# Patient Record
Sex: Female | Born: 1990 | Hispanic: Yes | Marital: Single
Health system: Southern US, Community
[De-identification: ages and names within clinical notes are randomized; demographics above are authoritative.]

## PROBLEM LIST (undated history)

## (undated) DIAGNOSIS — Z789 Other specified health status: Secondary | ICD-10-CM

## (undated) HISTORY — PX: NO PAST SURGERIES: SHX2092

---

## 2013-04-07 ENCOUNTER — Encounter (HOSPITAL_COMMUNITY): Payer: Self-pay | Admitting: *Deleted

## 2013-04-07 ENCOUNTER — Inpatient Hospital Stay (HOSPITAL_COMMUNITY)
Admission: AD | Admit: 2013-04-07 | Discharge: 2013-04-08 | Disposition: A | Discharge status: Home / Self Care | Payer: Self-pay | Source: Ambulatory Visit | Attending: Obstetrics & Gynecology | Admitting: Obstetrics & Gynecology

## 2013-04-07 ENCOUNTER — Inpatient Hospital Stay (HOSPITAL_COMMUNITY): Payer: Self-pay

## 2013-04-07 DIAGNOSIS — N920 Excessive and frequent menstruation with regular cycle: Secondary | ICD-10-CM | POA: Insufficient documentation

## 2013-04-07 DIAGNOSIS — N921 Excessive and frequent menstruation with irregular cycle: Secondary | ICD-10-CM

## 2013-04-07 DIAGNOSIS — N926 Irregular menstruation, unspecified: Secondary | ICD-10-CM | POA: Insufficient documentation

## 2013-04-07 DIAGNOSIS — R109 Unspecified abdominal pain: Secondary | ICD-10-CM | POA: Insufficient documentation

## 2013-04-07 HISTORY — DX: Other specified health status: Z78.9

## 2013-04-07 LAB — URINALYSIS, ROUTINE W REFLEX MICROSCOPIC
BILIRUBIN URINE: NEGATIVE
Glucose, UA: NEGATIVE mg/dL
Ketones, ur: NEGATIVE mg/dL
Leukocytes, UA: NEGATIVE
Nitrite: NEGATIVE
PROTEIN: NEGATIVE mg/dL
Specific Gravity, Urine: 1.015 (ref 1.005–1.030)
UROBILINOGEN UA: 1 mg/dL (ref 0.0–1.0)
pH: 7 (ref 5.0–8.0)

## 2013-04-07 LAB — CBC
HCT: 28.2 % — ABNORMAL LOW (ref 36.0–46.0)
Hemoglobin: 9.3 g/dL — ABNORMAL LOW (ref 12.0–15.0)
MCH: 25.5 pg — ABNORMAL LOW (ref 26.0–34.0)
MCHC: 33 g/dL (ref 30.0–36.0)
MCV: 77.3 fL — AB (ref 78.0–100.0)
PLATELETS: 278 10*3/uL (ref 150–400)
RBC: 3.65 MIL/uL — ABNORMAL LOW (ref 3.87–5.11)
RDW: 15.5 % (ref 11.5–15.5)
WBC: 6.5 10*3/uL (ref 4.0–10.5)

## 2013-04-07 LAB — URINE MICROSCOPIC-ADD ON

## 2013-04-07 LAB — POCT PREGNANCY, URINE: Preg Test, Ur: NEGATIVE

## 2013-04-07 MED ORDER — KETOROLAC TROMETHAMINE 60 MG/2ML IM SOLN: 60.0000 mg | Freq: Once | INTRAMUSCULAR | Status: DC

## 2013-04-07 NOTE — MAU Note (Addendum)
PT SAYS SHE HAD LMP ON   12-26-  BUT STARTED VAG BLEEDING AGAIN ON 1-12.  LAST SEX- 12-19.    NO BIRTH CONTROL.     NO HPT.  [place]  H[place] 5 YEARS.     VAG BLEEDING IN TRIAGE-  SMALL AMT RED.  CRAMPING  STARTED  ON Monday.    TOOK - ADVIL, XS TYLENOL.

## 2013-04-07 NOTE — MAU Note (Signed)
Pt states she has been bleeding since  MOnday  04/04/2013. Pt states she has been having on the left side. Pt states she has had this pain before, but pain doesn't usually last this long and pain in stronger this time.

## 2013-04-07 NOTE — MAU Provider Note (Signed)
Chief Complaint: Vaginal Bleeding   First Provider Initiated Contact with Patient 04/07/13 2249      SUBJECTIVE HPI: Renee Fox is a 23 y.o. G0P0 female who presents to maternity admissions with heavy bleeding and cramping today. Mostly saturated 4-5 pads today. Saturated 1/2 pad in the past 4 hours. . Starting 03/18/2013. Bleeding had been stopped for several days, then started on 04/04/2013. Was light at first worsened today. Trying to conceive. Did not take pregnancy test. Last sexual intercourse 03/11/2013. Does not have a gynecologist. History of irregular sometimes prolonged and heavy periods not uncommon to have periods lasting 20 days. Takes ibuprofen 400 mg or extra strength Tylenol for cramping with partial relief. Rates pain 9/10 at worst. Slightly less than that now.  Past Medical History  Diagnosis Date  . Medical history non-contributory    OB History  Gravida Para Term Preterm AB SAB TAB Ectopic Multiple Living  0                Past Surgical History  Procedure Laterality Date  . No past surgeries     History   Social History  . Marital Status: Single    Spouse Name: N/A    Number of Children: N/A  . Years of Education: N/A   Occupational History  . Not on file.   Social History Main Topics  . Smoking status: Not on file  . Smokeless tobacco: Not on file  . Alcohol Use: Not on file  . Drug Use: Not on file  . Sexual Activity: Not on file   Other Topics Concern  . Not on file   Social History Narrative  . No narrative on file   No current facility-administered medications on file prior to encounter.   No current outpatient prescriptions on file prior to encounter.   No Known Allergies  ROS: Pertinent positive items in HPI with addition of occasional constipation. Last bowel movement today, normal. Denies fever, chills, passage of clots or tissue, dizziness, tachycardia, headaches, fatigue, vaginal discharge, urinary complaints, nausea, vomiting,  diarrhea, dyspareunia, acne, hirsutism, heat or cold intolerance.  OBJECTIVE Blood pressure 95/54, pulse 64, temperature 98.3 F (36.8 C), temperature source Oral, resp. rate 16, height 4\' 11"  (1.499 m), weight 62.596 kg (138 lb), last menstrual period 03/18/2013. GENERAL: Well-developed, well-nourished, overweight female in no acute distress.  HEENT: Normocephalic HEART: normal rate RESP: normal effort ABDOMEN: Soft, mild, diffuse low abdominal tenderness. Positive bowel sounds x4. Negative CVA tenderness. No guarding, masses or rebound tenderness. EXTREMITIES: Nontender, no edema NEURO: Alert and oriented SPECULUM EXAM: Refused. Small amount of dark red blood on pad.  LAB RESULTS Results for orders placed during the hospital encounter of 04/07/13 (from the past 24 hour(s))  URINALYSIS, ROUTINE W REFLEX MICROSCOPIC     Status: Abnormal   Collection Time    04/07/13  8:21 PM      Result Value Range   Color, Urine YELLOW  YELLOW   APPearance CLOUDY (*) CLEAR   Specific Gravity, Urine 1.015  1.005 - 1.030   pH 7.0  5.0 - 8.0   Glucose, UA NEGATIVE  NEGATIVE mg/dL   Hgb urine dipstick LARGE (*) NEGATIVE   Bilirubin Urine NEGATIVE  NEGATIVE   Ketones, ur NEGATIVE  NEGATIVE mg/dL   Protein, ur NEGATIVE  NEGATIVE mg/dL   Urobilinogen, UA 1.0  0.0 - 1.0 mg/dL   Nitrite NEGATIVE  NEGATIVE   Leukocytes, UA NEGATIVE  NEGATIVE  URINE MICROSCOPIC-ADD ON  Status: None   Collection Time    04/07/13  8:21 PM      Result Value Range   Squamous Epithelial / LPF RARE  RARE   WBC, UA 0-2  <3 WBC/hpf   RBC / HPF TOO NUMEROUS TO COUNT  <3 RBC/hpf   Bacteria, UA RARE  RARE  POCT PREGNANCY, URINE     Status: None   Collection Time    04/07/13  8:36 PM      Result Value Range   Preg Test, Ur NEGATIVE  NEGATIVE  CBC     Status: Abnormal   Collection Time    04/07/13  9:55 PM      Result Value Range   WBC 6.5  4.0 - 10.5 K/uL   RBC 3.65 (*) 3.87 - 5.11 MIL/uL   Hemoglobin 9.3 (*) 12.0 -  15.0 g/dL   HCT 16.1 (*) 09.6 - 04.5 %   MCV 77.3 (*) 78.0 - 100.0 fL   MCH 25.5 (*) 26.0 - 34.0 pg   MCHC 33.0  30.0 - 36.0 g/dL   RDW 40.9  81.1 - 91.4 %   Platelets 278  150 - 400 K/uL    IMAGING No results found.  MAU COURSE Toradol, CBC, pelvic ultrasound, GC Chlamydia, wet prep, UA.  Pain resolved with Toradol. Small amount of bleeding while in MAU. Discussed mild anemia per CBC today. Patient reports she has history of anemia. Bleeding stable.   Lengthy conversation with patient via Spanish interpreter about what is likely anovulatory bleeding. Discussing between that and fertility difficulties. Emergency room does not manage fertility issues. Can refer patient to [facility] hospital outpatient clinic for menorrhagia and abnormal uterine bleeding, but minimal infertility treatments available and has to be paid out of pocket.  ASSESSMENT 1. Menorrhagia with irregular cycle    PLAN Discharge home in stable condition. Gonorrhea and Chlamydia cultures done on urine, pending. Iron-Rich diet. Increase fluids. Bleeding precautions.     Follow-up Information   Follow up with [facility]. (will call you to schedule an appointment)    Specialty:  Obstetrics and Gynecology   Contact information:   [address] [place] [address] [phone]      Follow up with [facility]. (As needed in emergencies)    Contact information:   [address] [id number] [place] [address] [phone]       Medication List         acetaminophen 500 MG tablet  Commonly known as:  TYLENOL  Take 1,000 mg by mouth every 6 (six) hours as needed for moderate pain.     ibuprofen 600 MG tablet  Commonly known as:  ADVIL,MOTRIN  Take 1 tablet (600 mg total) by mouth every 6 (six) hours as needed for moderate pain.     medroxyPROGESTERone 10 MG tablet  Commonly known as:  PROVERA  Take 1 tablet (10 mg total) by mouth  daily.       [place], CNM 04/08/2013  2:05 AM

## 2013-04-08 DIAGNOSIS — N920 Excessive and frequent menstruation with regular cycle: Secondary | ICD-10-CM

## 2013-04-08 MED ORDER — IBUPROFEN 600 MG PO TABS: 600.0000 mg | ORAL_TABLET | Freq: Four times a day (QID) | ORAL | Status: AC | PRN

## 2013-04-08 MED ORDER — MEDROXYPROGESTERONE ACETATE 10 MG PO TABS: 10.0000 mg | ORAL_TABLET | Freq: Every day | ORAL | Status: AC

## 2013-04-08 NOTE — Discharge Instructions (Signed)
Hemorragia uterina disfuncional (Abnormal Uterine Bleeding) La hemorragia uterina disfuncional puede tener muchas causas. En algunos casos se mejora simplemente con un tratamiento. En otros casos puede ser ms grave. Hay varios tipos de hemorragia que se consideran disfuncionales. Ellas son:  Doristine Mango perodos.  Hemorragia durante las [employer].  Prdida de sangre en cualquier momento del ciclo menstrual.  Hemorragia abundante o ms que lo habitual.  Sangrado luego de la menopausia. CAUSAS Hay numerosas causas que originan este problema. Puede ocurrir en adolescentes, mujeres embarazadas, en aquellas que se encuentran en su vida reproductiva y en las que han llegado a la menopausia. El mdico buscar las causas ms comunes, de acuerdo a su edad, los signos y los sntomas que presenta y su Dance movement psychotherapist. En la [employer] no reviste gravedad y se trata sin dificultad. An en los casos ms graves, como el cncer en los rganos femeninos. puede tratarse adecuadamente si se detecta en etapas tempranas. Es por eso que todos los tipos de hemorragia deben evaluarse y tratarse lo antes posible. DIAGNSTICO El diagnstico de la causa puede requerir varios tipos de pruebas. El profesional podr:  Education officer, environmental una historia completa del tipo de hemorragia.  Realizar un examen fsico completo y un papanicolau.  Indicar una ecografa de abdomen que muestre una imagen de los rganos femeninos y de la pelvis.  Podr inyectar una sustancia de contraste en el tero y las trompas de Falopio y tomar radiografas (histerosalpingografa).  Inyectar lquido en el tero para realizar una ecografa (sonohisterografa)  Indicar una tomografa computada para examinar los rganos femeninos y la pelvis.  Indicar una resonancia magntica para examinar los rganos femeninos y la pelvis. Este procedimiento no implica la aplicacin de rayos X.  Observar el interior del tero con un  dispositivo similar a un telescopo que tiene una luz en un extremo (histeroscopio).  Realizar un raspado del tero para obtener muestras de tejido y examinarlas (dilatacin y curetaje).  Observar el interior de la pelvis con un dispositivo similar a un telescopio que tiene una luz en un extremo (laparoscopio). Esto se lleva a cabo a travs de un corte muy pequeo (incisin) en el abdomen. TRATAMIENTO El tratamiento depender de la causa. Podr incluir:  No tomar medidas y dejar que el problema se solucione por s mismo.  Tratamiento hormonal.  Pldoras anticonceptivas.  Tratamiento del problema mdico que causa la hemorragia.  Laparoscopa:  Ciruga mayor o menor.  Destruccin del tapizado interno de la pared del tero con corriente elctrica, rayo lser, congelamiento o calor (ablacin uterina). INSTRUCCIONES PARA EL CUIDADO DOMICILIARIO  Siga las recomendaciones de su mdico acerca de cmo tratar el problema.  Consulte con su mdico si no tiene su perodo menstrual y piensa que puede estar [place].  Si presenta una hemorragia abundante, cuente el nmero de apsitos o tampones que [place] y con qu frecuencia debe cambiarlos. Convrselo con el profesional que la asiste.  Evite las relaciones sexuales hasta que el problema sea controlado. SOLICITE ATENCIN MDICA SI:  Sufre algn tipo de hemorragia disfuncional como las ya mencionadas.  Se siente mareada.  Tiene ms de 82 aos y an no ha tenido su perodo menstrual. SOLICITE ATENCIN MDICA DE INMEDIATO SI:  Se desmaya.  Debe cambiarse el apsito o el tampn cada 15 a 30 minutos.  Siente dolor abdominal.  La temperatura se eleva por encima de 100 F (37.8 C).  Berenice Primas o se siente dbil.  Elimina cogulos grandes por la vagina.  Si tiene  malestar estomacal (nuseas) y vmitos. Document Released: 03/10/2005 Document Revised: 06/02/2011 [facility] Patient Information 2014 [place],  [place].  Infertilidad (Infertility) QU ES LA INFERTILIDAD?  La infertilidad normalmente se define como la incapacidad para quedar embarazada luego de un ao de relaciones sexuales regulares sin la utilizacin de mtodos anticonceptivos. O la incapacidad de llevar a trmino Chartered loss adjuster y Warehouse manager el beb. La tasa de infertilidad en los [place] es de alrededor del 10%. El [address] es el resultado de una cadena de sucesos. La mujer debe liberar el vulo de uno de sus ovarios (ovulacin). El vulo debe fertilizarse con el esperma. Luego viaja a travs de las trompas de Falopio hacia el tero (matriz), donde se une a la pared del tero y crece. El hombre debe tener suficiente esperma y el esperma debe unirse con el vulo (fertilizar) en el momento justo. El vulo fertilizado debe luego unirse al interior del tero. Esto parece simple, pero pueden ocurrir [employer] cosas que evitan que el embarazo se produzca.  DE QUIN ES EL PROBLEMA?  Un 20% de los casos de infertilidad se deben a problemas del hombres (factores masculinos) y un 65% se debe a problemas de la mujer (factores femeninos). Otras causas pueden ser Neomia Dear combinacin de factores masculinos y femeninos o a causas desconocidas.  CULES SON LAS CAUSAS DE LA INFERTILIDAD EN EL HOMBRE?  La infertilidad en el hombre a menudo se debe a problemas para producir el esperma o hacer que el mismo llegue al vulo. Los problemas de esperma pueden existir desde el nacimiento o desarrollarse ms tarde debido a enfermedades o lesiones. Algunos hombres no producen esperma, o producen muy poco (oligospermia). Entre otros problemas se incluyen: Disfuncin sexual Problemas hormonales o endocrinos. La edad. La fertilidad del hombre disminuye con la edad, pero no tan temprano como la femenina. Infecciones. Problemas congnitos Defectos de nacimiento, como ausencia de los tubos que transportan el esperma (conductos deferentes). Problemas genticos o de  cromosomas. Problemas de anticuerpos antiesperma. Eyaculacin retrgrada (el esperma va hacia la vejiga). Varicoceles, espermatoceles o tumores en los testculos. El estilo de vida puede influir en el nmero y la calidad del esperma del hombre. El alcohol y las drogas pueden reducir temporalmente la calidad del esperma. Las toxinas [address], como los pesticidas y el plomo, pueden ocasionar algunos casos de infertilidad en hombres. CULES SON LAS CAUSAS DE LA INFERTILIDAD EN LA MUJER?  La infertilidad en las mujeres est causada mayormente por problemas con la ovulacin. Sin ovulacin, el vulo no puede fertilizarse. Seales de problemas de ovulacin son perodos menstruales irregulares o ningn perodo. Factores simples del estilo de vida, como el estrs, la dieta, o el entrenamiento deportivo, pueden afectar el balance hormonal de Medical laboratory scientific officer. La edad. La fertilidad comienza a IT consultant alrededor de los 30 aos y [place] a Glass blower/designer de los 37. Mucho menos a menudo, un desequilibrio hormonal por un problema mdico serio como un tumor en la glndula pituitaria, tiroides u otra enfermedad mdica crnica pueden ocasionar problemas de ovulacin. Infecciones plvicas. Sndrome de ovarios poliqusticos (aumento de hormonas masculinas, incapacidad de ovular). Consumo de alcohol o drogas. Toxinas ambientales, radiacin, pesticidas y ciertos qumicos. La edad es un factor importante en la infertilidad femenina. La capacidad de los ovarios de una mujer para producir vulos disminuye con la edad, especialmente despus de los 35 aos. Alrededor de un tercio de las parejas en las que la mujer tiene ms de 35 aos tendr problemas de infertilidad. En el momento en el  que alcanza la [place], cuando su perodo menstrual se detiene, la mujer ya no puede producir vulos ni quedar embarazada. Otros problemas tambin pueden llevar a la infertilidad en las mujeres. Si las trompas de [employer] estn  bloqueadas en [employer] extremos, el vulo no puede pasar a travs de los tubos [place]. Tejido cicatrizal (adhesiones) en la pelvis que pueden obstruir las trompas. Esto puede dar como resultado una enfermedad inflamatoria plvica, endometriosis o una ciruga por embarazo ectpico (en la que el vulo fertilizado se ha implantado fuera del tero) o cualquier ciruga plvica o abdominal que ocasione adherencias. Tumores fibroides o plipos en el tero. Anormalidades congnitas (de nacimiento) del tero. Infecciones en el cuello del tero (cervicitis). Estenosis cervical (estrechamiento). Mucosidad cervical anormal. Sndrome poliqustico de los ovarios. Tener relaciones sexuales muy seguidas ([place] 4 a 5 veces por semana). Obesidad. Anorexia. Dficit nutricional. Mucho ejercicio, con prdida de grasa corporal. DES. Su madre ha recibido la hormona dietilstilbesterol cuando estaba embarazada de usted. CMO SE ANALIZA LA INFERTILIDAD?  Si ha estado tratando de quedar embarazada sin xito, podra querer buscar ayuda mdica. No debera esperar un ao de intentos sin xito antes de buscar ayuda profesional si: Tiene mas de 45 aos de edad Tiene razones para creer que podra tener problemas de fertilidad. Un examen mdico determinar las causas de infertilidad de la pareja, Normalmente el proceso comienza con: Exmenes fsicos Historias clnicas de ambos miembros de la pareja. Historiales sexuales de ambos miembros de la pareja. Si no hay un problema evidente, como relaciones sexuales en tiempos inadecuados o ausencia de ovulacin, se necesitarn Training and development officer.  Para el hombre, los anlisis normalmente comienzan con pruebas de semen para observar: La cantidad de esperma. La forma del esperma. El movimiento del esperma. Realizar un historial clnico y quirrgico completo. Examen fsico. Control de infecciones en los rganos reproductivos. Podrn realizarle anlisis  hormonales.  Para la mujer, el primer paso del anlisis es saber si ovula cada mes. Hay diferentes modos de Secondary school teacher. Por ejemplo, puede llevar un registro de los cambios en la temperatura corporal por la maana y la textura de la mucosidad cervical. Otra herramienta es un kit de prueba de ovulacin casero, que puede comprarse en la farmacia. Tambin pueden realizarse controles de ovulacin en el consultorio mdico, mediante anlisis de sangre para observar los niveles de hormonas o pruebas de [employer] ovarios. Si la mujer est ovulando, se necesitarn realizar ms exmenes. Algunas femeninas comunes incluyen: Histerosalpingografa: Se realizan rayos x de las trompas de Falopio y el tero con una inyeccin de [place]. Mostrar si las trompas estn abiertas y la forma del tero. Laparoscopa: Un anlisis de las trompas y otros rganos femeninos para Risk manager. Se utiliza un tubo con iluminacin llamado laparoscopio para observar el interior del abdomen. Biopsia endometrial: Se toma una muestra del tejido del tero Engineer, manufacturing systems del perodo menstrual, para ver si el tejido le indica si est ovulando. Prueba de ultrasonido transvaginal: Examina los rganos femeninos. Histeroscopa: Utiliza un tubo con iluminacin para examinar el cuello del tero y el tero y ver si hay anormalidades dentro del mismo. TRATAMIENTO Dependiendo de los [employer], se sugerirn Engineer, technical sales. El tratamiento depende de la causa. Entre el 70 y el 90% de los casos de infertilidad se tratan con drogas o [place].  Hay varias drogas para la fertilidad que pueden utilizarse para las mujeres con problemas de ovulacin. Es importante hablar con el  profesional que la asiste sobre la droga a Risk manager. Deber comprender los beneficios y Platinum drogas. Segn el tipo de droga para la fertilidad y la dosis [place], algunas mujeres podran tener embarazos mltiples  (mellizos). De ser [employer], se puede realizar una ciruga para reparar daos en los ovarios de la [place], trompas de [place], el cuello del tero o el tero. Tratamiento quirrgico o mdico para la endometriosis o el sndrome de ovario poliqustico. A veces, los problemas de fertilidad en el hombre pueden corregirse con medicamentos o [place]. Inseminacin intrauterina, IUI, de esperma en concordancia con la ovulacin. Cambios en el estilo de vida, si esta es la causa (prdida de [place], aumento de ejercicios, dejar de fumar, beber excesivamente o tomar drogas ilegales). Otros tipos de ciruga: Extirpar tumores por dentro o sobre el tero. Eliminar tejido cicatrizal de dentro del tero. Arreglar trompas obstruidas. Eliminar tejido cicatrizal en la pelvis y alrededor de los rganos femeninos. QU ES LA TECNOLOGA DE REPRODUCCIN ASISTIDA (ART)?  La tecnologa de reproduccin asistida (ART) utiliza mtodos especiales para ayudar a parejas infrtiles. En esta tcnica se manipula tanto el vulo de la mujer como el esperma del hombre. El xito depende de muchos factores. La ART puede ser cara y demandar mucho tiempo. Pero ha hecho posible que muchas parejas tengan hijos que de otra manera no hubieran podido. A continuacin se enumeran algunos mtodos: Fertilizacin. In Vitro (FIV). In Vitro (IVF) es un procedimiento que se hizo famoso con el nacimiento en [place], el primer "beb de probeta" del mundo. Se utiliza cuando las trompas de [employer] de la mujer estn bloqueadas o cuando el hombre tiene poco esperma. Se utiliza una droga para estimular los ovarios y producir mltiples vulos. Una vez maduros, los vulos se retiran y se Copywriter, advertising en una placa de cultivo con el esperma del hombre para la fertilizacin. Luego de aproximadamente 40 horas, los vulos se examinan para ver si han sido fertilizados por el esperma y se han dividido en clulas. Esos vulos fertilizados (embriones) se colocan luego  en el tero de la mujer. Esto evita el paso por las trompas de [place]. La transferencia intrafalopiana de gametas (GIFT) es similar al IVF, pero se utiliza cuando la mujer tiene al menos una trompa de falopio normal. Se colocan de tres a cinco vulos en la trompa de Falopio, junto con el esperma del hombre, para que la fertilizacin se realice dentro del cuerpo de Retail banker. La transferencia intrafalopiana de cigotos (ZIFT), tambin se denomina transferencia embrionaria, y [place] el IVF con el GIFT. Los vulos retirados de los ovarios de la mujer se Health visitor laboratorio y se Copywriter, advertising en las trompas de [employer] en lugar de en el tero. Los procedimientos de ART a menudo suponen la utilizacin de donantes de vulos (de [place] mujer) o de embriones congelados previamente. Los donantes de vulos pueden utilizarse si la mujer tiene [employer] ovarios o posee una enfermedad gentica que podra transmitirla al beb. Cuando se realiza una ART hay mayor riesgo de embarazos mltiples, mellizos, trillizos o ms. La inyeccin de esperma intracitoplasmtica es un procedimiento que inyecta un espermatozoide en el vulo para fertilizarlo. El trasplante embrionario es un procedimiento que comienza con un embrin que se ha desarrollado en un medio especial (solucin qumica) preparado para mantener el embrin vivo por 2 a 5 das, y Heritage manager. En los [employer] que no puede encontrarse la causa y [place] no  se produce, podr considerarse la adopcin. Document Released: 03/30/2007 Document Revised: 06/02/2011 [facility] Patient Information 2014 [place], [place].

## 2013-04-21 ENCOUNTER — Encounter: Payer: Self-pay | Admitting: Nurse Practitioner

## 2013-05-05 ENCOUNTER — Encounter: Payer: Self-pay | Admitting: *Deleted

## 2013-05-05 ENCOUNTER — Encounter: Payer: Self-pay | Admitting: Nurse Practitioner

## 2013-05-05 NOTE — Progress Notes (Signed)
Patient did not keep appointment for followup today. Her chart was reviewed by Jannifer RodneyLinda Barefoot. Linda determined that patient doesn't need to be rescheduled at this time.

## 2014-06-27 IMAGING — US US TRANSVAGINAL NON-OB
1 series · 14 of 25 positions shown · non-contrast
Comparison: None

CLINICAL DATA: Left lower quadrant pain and irregular bleeding



[Series 1: us pelvis complete · 14 of 73 slices shown]
[im 1/73]
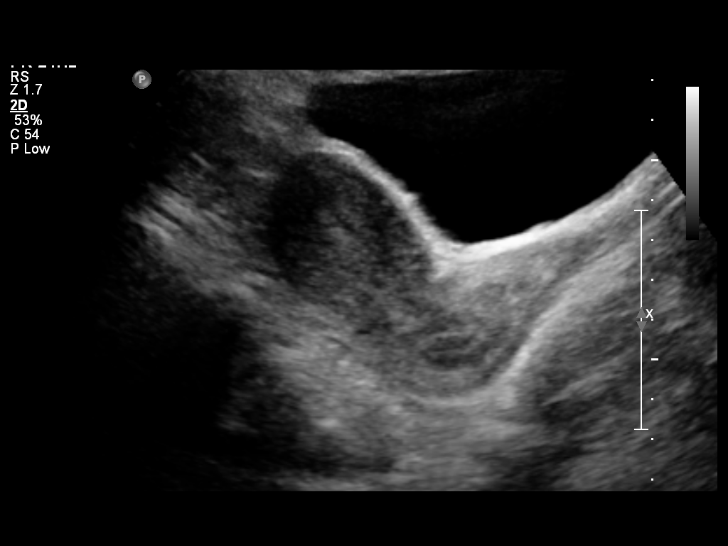
[im 7/73]
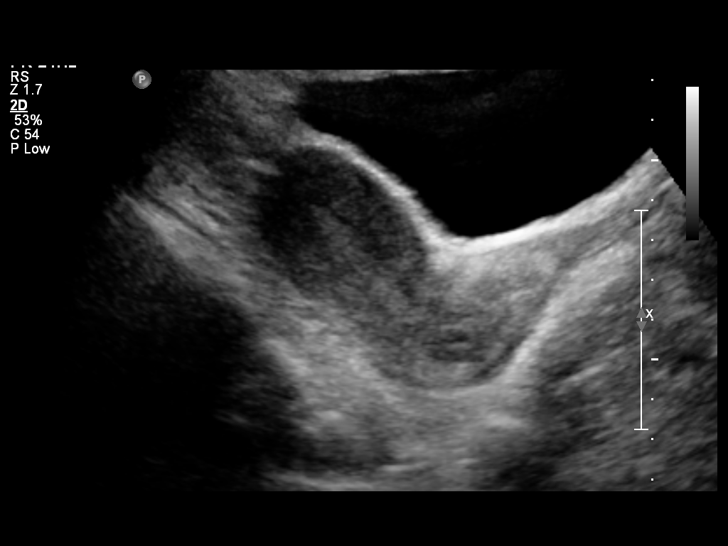
[im 13/73]
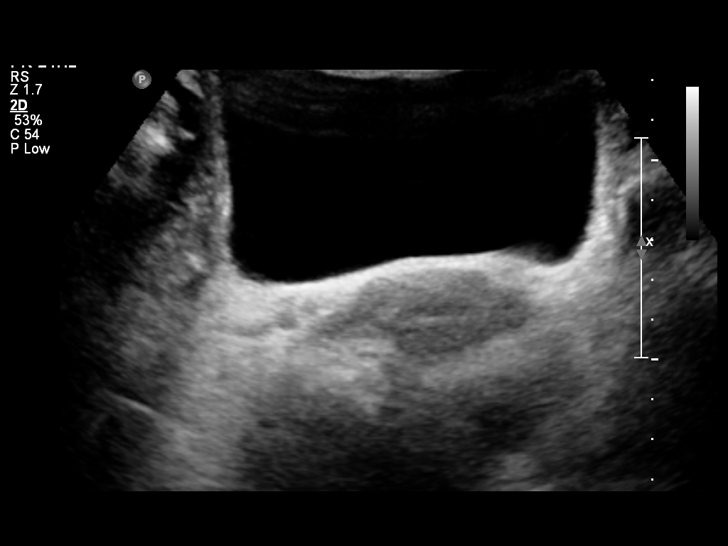
[im 19/73]
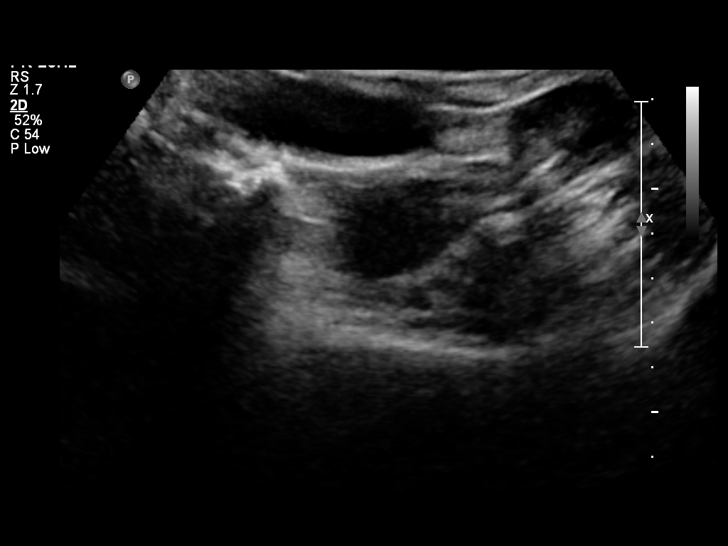
[im 25/73]
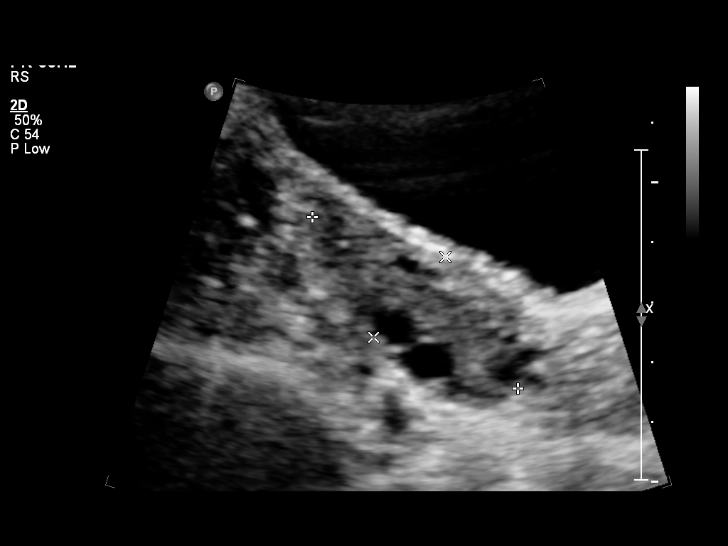
[im 28/73]
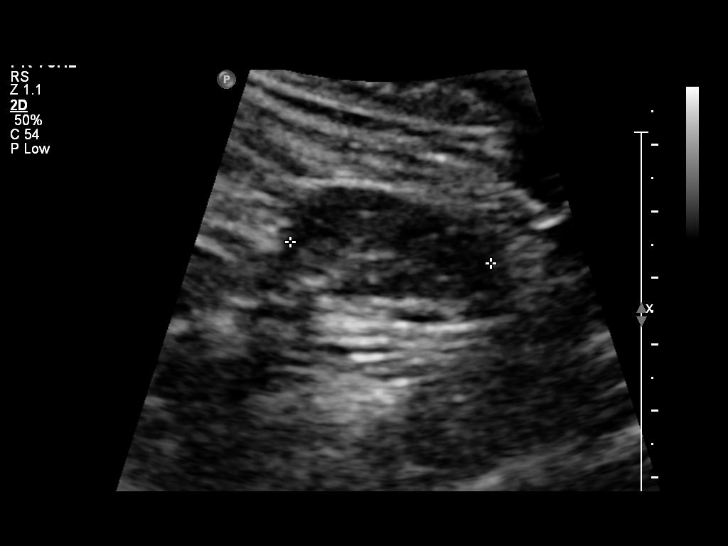
[im 34/73]
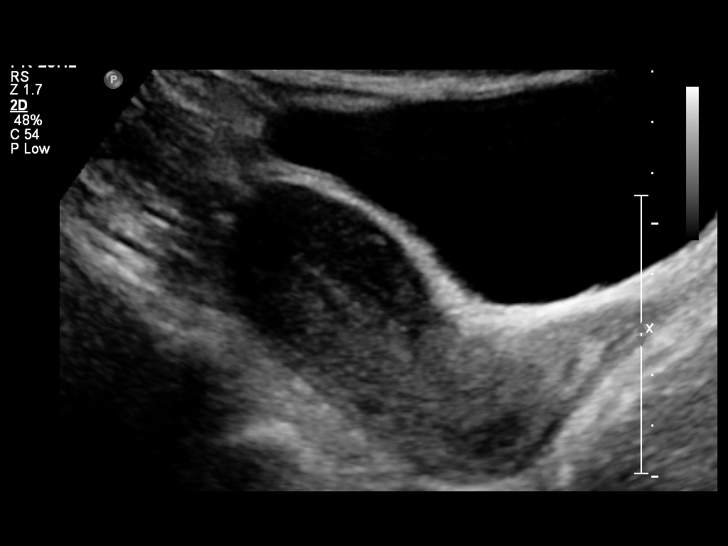
[im 40/73]
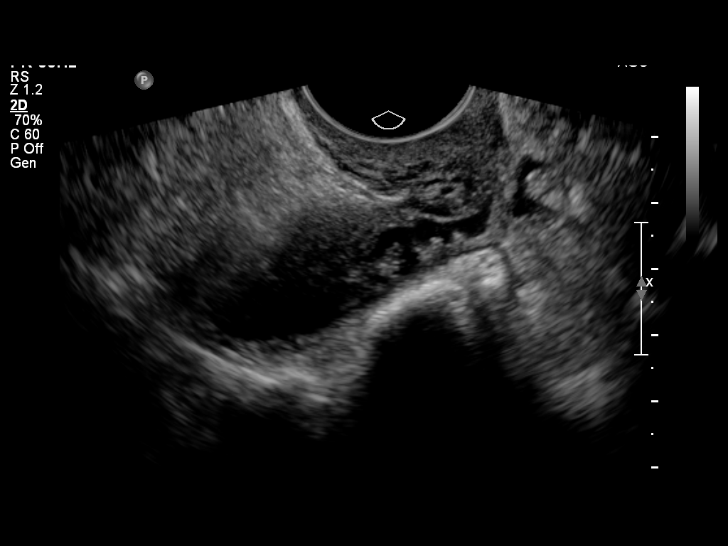
[im 46/73]
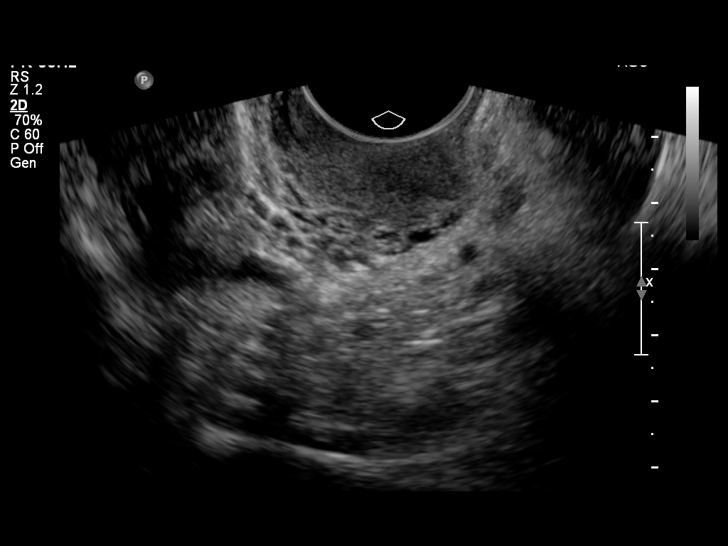
[im 49/73]
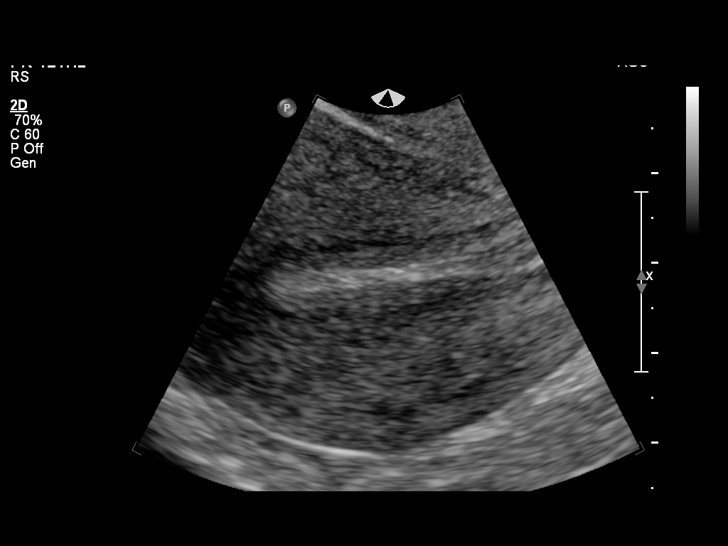
[im 55/73]
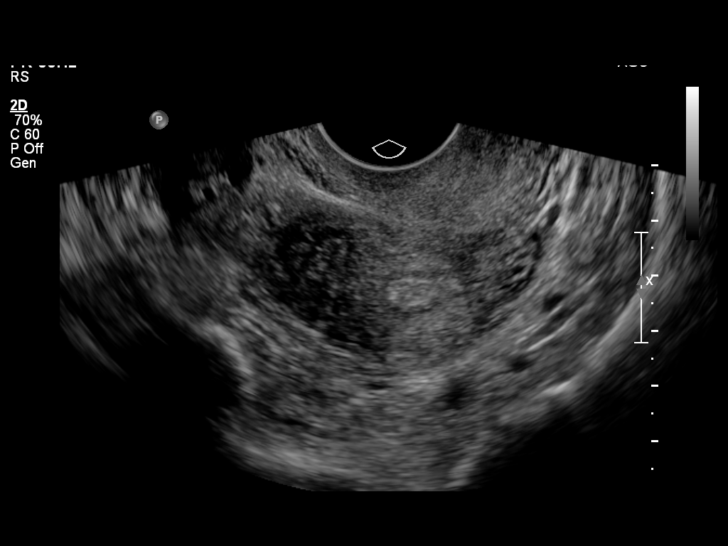
[im 61/73]
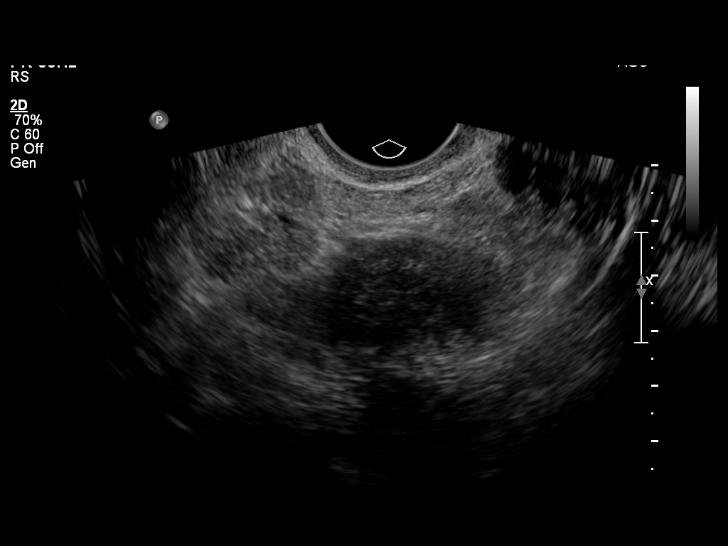
[im 67/73]
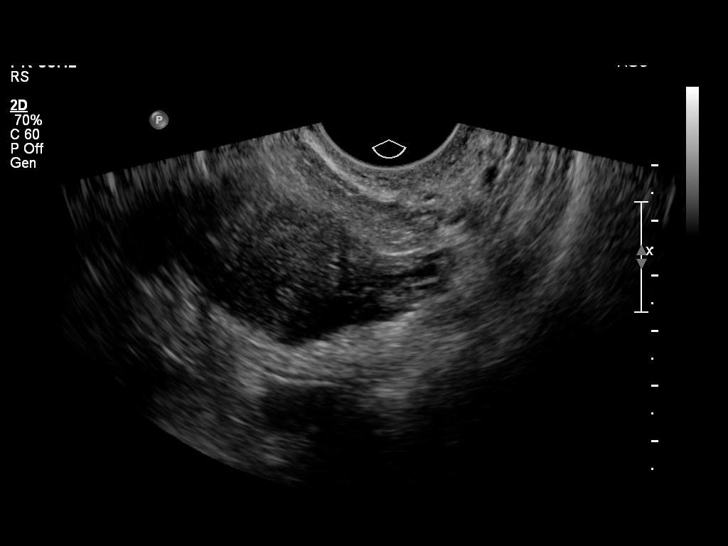
[im 73/73]
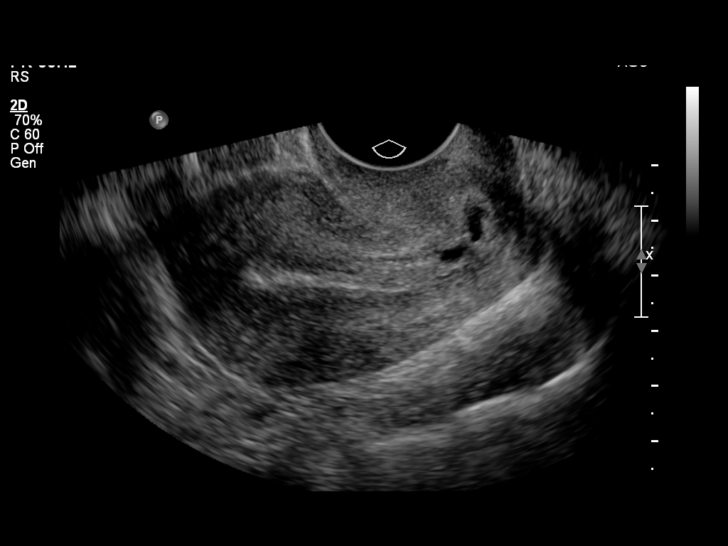

[14 of 25 positions shown; findings below may reference images not displayed]

FINDINGS: Uterus

Measurements: 6 x 4 x 5 cm. No fibroids or other mass visualized.
There are incidental nabothian cysts.

Endometrium

Thickness: 7 mm.  No focal abnormality visualized.

Right ovary

Measurements: 3.5 x 2 x 3 cm. Normal appearance/no adnexal mass.

Left ovary

Measurements: 3 x 1.5 x 2.4 cm. Normal appearance/no adnexal mass.

Other findings

No free fluid.
IMPRESSION: Negative pelvic sonogram.
